# Patient Record
Sex: Male | Born: 1955 | Race: White | Hispanic: No | Marital: Married | Smoking: Never smoker
Health system: Southern US, Community
[De-identification: ages and names within clinical notes are randomized; demographics above are authoritative.]

## PROBLEM LIST (undated history)

## (undated) DIAGNOSIS — K219 Gastro-esophageal reflux disease without esophagitis: Secondary | ICD-10-CM

## (undated) DIAGNOSIS — C801 Malignant (primary) neoplasm, unspecified: Secondary | ICD-10-CM

## (undated) DIAGNOSIS — N189 Chronic kidney disease, unspecified: Secondary | ICD-10-CM

## (undated) HISTORY — PX: APPENDECTOMY: SHX54

## (undated) HISTORY — PX: TONSILLECTOMY: SUR1361

---

## 2012-02-29 ENCOUNTER — Other Ambulatory Visit (HOSPITAL_BASED_OUTPATIENT_CLINIC_OR_DEPARTMENT_OTHER): Payer: Self-pay | Admitting: Family Medicine

## 2012-02-29 DIAGNOSIS — R109 Unspecified abdominal pain: Secondary | ICD-10-CM

## 2012-03-01 ENCOUNTER — Ambulatory Visit (HOSPITAL_BASED_OUTPATIENT_CLINIC_OR_DEPARTMENT_OTHER)
Admission: RE | Admit: 2012-03-01 | Discharge: 2012-03-01 | Disposition: A | Discharge status: Home / Self Care | Payer: Medicare PPO | Source: Ambulatory Visit | Attending: Family Medicine | Admitting: Family Medicine

## 2012-03-01 DIAGNOSIS — R109 Unspecified abdominal pain: Secondary | ICD-10-CM

## 2012-03-01 DIAGNOSIS — R319 Hematuria, unspecified: Secondary | ICD-10-CM | POA: Insufficient documentation

## 2012-03-01 DIAGNOSIS — N200 Calculus of kidney: Secondary | ICD-10-CM | POA: Insufficient documentation

## 2012-03-08 ENCOUNTER — Other Ambulatory Visit: Payer: Self-pay | Admitting: Urology

## 2012-03-09 MED ORDER — CIPROFLOXACIN HCL 500 MG PO TABS: 500.0000 mg | ORAL_TABLET | ORAL | Status: DC

## 2012-03-16 ENCOUNTER — Encounter (HOSPITAL_COMMUNITY): Payer: Self-pay | Admitting: Pharmacy Technician

## 2012-03-16 NOTE — Progress Notes (Signed)
1338  Spoke with Pam only give Cipro 200 mg IV with the cysto stent procedure at 0730 no antibiotic with the litho and do the KUB before the first procedure  It's okay per Dr. Vernie Ammons.

## 2012-03-24 NOTE — Patient Instructions (Addendum)
20 Marvin Patterson  03/24/2012   Your procedure is scheduled on: 10-28  -2013  Report to [facility] at     0530   AM   Call this number if you have problems the morning of surgery: [phone]  Or Presurgical Testing [phone]([name])   Remember: Bring blue folder with all forms completely filled out and signed. Follow instructions on any laxatives. Bring insurance card and picture ID.   Do not eat food:After Midnight.    Take these medicines the morning of surgery with A SIP OF WATER: none   Do not wear jewelry, make-up or nail polish.  Do not wear lotions, powders, or perfumes. You may wear deodorant.  Do not shave 48 hours prior to surgery.(face and neck okay, no shaving of legs)  Do not bring valuables to the hospital.  Contacts, dentures or bridgework may not be worn into surgery.  Leave suitcase in the car. After surgery it may be brought to your room.  For patients admitted to the hospital, checkout time is 11:00 AM the day of discharge.   Patients discharged the day of surgery will not be allowed to drive home. Must have responsible person with you x 24 hours once discharged.  Name and phone number of your driver: son Molli Hazard [phone] cell  Special Instructions: CHG Shower Use Special Wash: see special instruction sheet.(avoid face and genitals)   Please read over the following fact sheets that you were given: MRSA Information.

## 2012-03-25 ENCOUNTER — Encounter (HOSPITAL_COMMUNITY)
Admission: RE | Admit: 2012-03-25 | Discharge: 2012-03-25 | Disposition: A | Discharge status: Home / Self Care | Payer: 59 | Source: Ambulatory Visit | Attending: Urology | Admitting: Urology

## 2012-03-25 ENCOUNTER — Encounter (HOSPITAL_COMMUNITY): Payer: Self-pay

## 2012-03-25 DIAGNOSIS — C801 Malignant (primary) neoplasm, unspecified: Secondary | ICD-10-CM

## 2012-03-25 DIAGNOSIS — N189 Chronic kidney disease, unspecified: Secondary | ICD-10-CM

## 2012-03-25 HISTORY — DX: Chronic kidney disease, unspecified: N18.9

## 2012-03-25 HISTORY — DX: Gastro-esophageal reflux disease without esophagitis: K21.9

## 2012-03-25 HISTORY — DX: Malignant (primary) neoplasm, unspecified: C80.1

## 2012-03-25 HISTORY — PX: SALIVARY STONE REMOVAL: SHX5213

## 2012-03-25 HISTORY — PX: SKIN CANCER EXCISION: SHX779

## 2012-03-25 LAB — SURGICAL PCR SCREEN
MRSA, PCR: NEGATIVE
Staphylococcus aureus: NEGATIVE

## 2012-03-25 NOTE — Pre-Procedure Instructions (Signed)
03-25-12 Pt. Aware will have KUB on arrival 03-28-12. Pt's wife has hx. Dementia, son to drive pt. Home.

## 2012-03-25 NOTE — H&P (Signed)
History of Present Illness     Left renal calculus: A CT scan done on 03/01/12 revealed a 12 mm stone located in the renal pelvis of the left kidney. There was some associated inflammatory changes. The stone had Hounsfield units of approximately 1100.  He reports that possibly as far back as a year ago he had occasional twinges of intermittent pain in the left flank region. More recently has been having some abdominal discomfort on the left-hand side and noted that his urine was dark but not read. He was seen, evaluated and placed on medication for what were felt to be GI symptoms at the time but then he developed some pain in his back when he was doing some lifting around the house and eventually saw gross hematuria. That is when the CT scan was obtained and he is seen today not having any severe pain in his flank or hematuria this time. He has no prior history of stones.   Past Medical History Problems  1. History of  Esophageal Reflux 530.81 2. History of  Hiatal Hernia 553.3 3. History of  Hypercholesterolemia 272.0 4. History of  Skin Cancer V10.83  Surgical History Problems  1. History of  Appendectomy 2. History of  Tonsillectomy With Adenoidectomy  Current Meds 1. No Reported Medications  Allergies Medication  1. Amoxicillin TABS 2. Erythromycin TABS  Family History Problems  1. Family history of  Aneurysm Of The Abdominal Aorta 2. Family history of  Chronic Lupus Erythematosus 3. Family history of  Pulmonary Disease 4. Family history of  Rheumatoid Arthritis  Social History Problems    Being A Social Drinker   Caffeine Use   Marital History - Currently Married   Never A Smoker  Review of Systems Genitourinary, constitutional, skin, eye, otolaryngeal, hematologic/lymphatic, cardiovascular, pulmonary, endocrine, musculoskeletal, gastrointestinal, neurological and psychiatric system(s) were reviewed and pertinent findings if present are noted.    Vitals Vital  Signs BMI Calculated: 34.36 BSA Calculated: 2.26 Height: 5 ft 10 in Weight: 240 lb  Blood Pressure: 160 / 92 Heart Rate: 94  Physical Exam Constitutional: Well nourished and well developed . No acute distress.  ENT:. The ears and nose are normal in appearance.  Neck: The appearance of the neck is normal and no neck mass is present.  Pulmonary: No respiratory distress and normal respiratory rhythm and effort.  Cardiovascular: Heart rate and rhythm are normal . No peripheral edema.  Abdomen: The abdomen is soft and nontender. No masses are palpated. No CVA tenderness. No hernias are palpable. No hepatosplenomegaly noted.  Lymphatics: The femoral and inguinal nodes are not enlarged or tender.  Skin: Normal skin turgor, no visible rash and no visible skin lesions.  Neuro/Psych:. Mood and affect are appropriate.    Results/Data Urine COLOR RED   APPEARANCE CLEAR   SPECIFIC GRAVITY <1.005   pH 5.5   GLUCOSE NEG mg/dL  BILIRUBIN NEG   KETONE NEG mg/dL  BLOOD LARGE   PROTEIN 30 mg/dL  UROBILINOGEN 0.2 mg/dL  NITRITE NEG   LEUKOCYTE ESTERASE NEG   SQUAMOUS EPITHELIAL/HPF RARE   WBC 0-2 WBC/hpf  RBC 7-10 RBC/hpf  BACTERIA NONE SEEN   CRYSTALS NONE SEEN   CASTS NONE SEEN    Old records or history reviewed: Notes from Dr. Pablo Lawrence office as above.  The following images/tracing/specimen were independently visualized:  CT scan as above.  The following clinical lab reports were reviewed:  On 2 occasions his urinalysis had a pH of 5.0 and a urine  culture was found to be negative.  The following radiology reports were reviewed: CT scan.    Assessment Assessed  1. Nephrolithiasis Of The Left Kidney 592.0   We discussed the fact that he has a 12 mm left renal pelvic stone which could be managed with ureteroscopy although it might be somewhat of a challenge. We also discussed a percutaneous nephrolithotomy as an option and also lithotripsy. I gone over each of these as well as the pluses  and minuses associated. He would like to treat his stone with lithotripsy if possible and I told them with a stone of that size it very likely may require more than one treatment. In addition because of its large size I have recommended the placement of a double-J stent prior to initiating treatment. We've gone over this procedure and lithotripsy in detail. I've answered all of his questions and he has elected to proceed.   Plan    He will be scheduled for a left double-J stent placement followed by lithotripsy.

## 2012-03-28 ENCOUNTER — Ambulatory Visit (HOSPITAL_COMMUNITY): Payer: 59 | Admitting: Anesthesiology

## 2012-03-28 ENCOUNTER — Ambulatory Visit (HOSPITAL_COMMUNITY): Payer: 59

## 2012-03-28 ENCOUNTER — Encounter (HOSPITAL_COMMUNITY): Payer: Self-pay | Admitting: *Deleted

## 2012-03-28 ENCOUNTER — Encounter (HOSPITAL_COMMUNITY)
Admission: RE | Disposition: A | Discharge status: Home / Self Care | Payer: Self-pay | Source: Ambulatory Visit | Attending: Urology

## 2012-03-28 ENCOUNTER — Ambulatory Visit (HOSPITAL_COMMUNITY)
Admission: RE | Admit: 2012-03-28 | Discharge: 2012-03-28 | Disposition: A | Discharge status: Home / Self Care | Payer: 59 | Source: Ambulatory Visit | Attending: Urology | Admitting: Urology

## 2012-03-28 ENCOUNTER — Encounter (HOSPITAL_COMMUNITY): Payer: Self-pay | Admitting: Anesthesiology

## 2012-03-28 DIAGNOSIS — Z01812 Encounter for preprocedural laboratory examination: Secondary | ICD-10-CM | POA: Insufficient documentation

## 2012-03-28 DIAGNOSIS — K449 Diaphragmatic hernia without obstruction or gangrene: Secondary | ICD-10-CM | POA: Insufficient documentation

## 2012-03-28 DIAGNOSIS — E7800 Pure hypercholesterolemia, unspecified: Secondary | ICD-10-CM | POA: Insufficient documentation

## 2012-03-28 DIAGNOSIS — N200 Calculus of kidney: Secondary | ICD-10-CM | POA: Diagnosis present

## 2012-03-28 DIAGNOSIS — K219 Gastro-esophageal reflux disease without esophagitis: Secondary | ICD-10-CM | POA: Insufficient documentation

## 2012-03-28 SURGERY — CYSTOSCOPY, WITH STENT INSERTION
Anesthesia: General | Laterality: Left | Wound class: Clean Contaminated

## 2012-03-28 SURGERY — LITHOTRIPSY, ESWL
Anesthesia: LOCAL | Laterality: Left

## 2012-03-28 MED ORDER — LACTATED RINGERS IV SOLN: INTRAVENOUS | Status: DC

## 2012-03-28 MED ORDER — MIDAZOLAM HCL 5 MG/5ML IJ SOLN
INTRAMUSCULAR | Status: DC | PRN
  Administered 2012-03-28: 2 mg via INTRAVENOUS

## 2012-03-28 MED ORDER — ACETAMINOPHEN 10 MG/ML IV SOLN
INTRAVENOUS | Status: AC
  Filled 2012-03-28: qty 100

## 2012-03-28 MED ORDER — ACETAMINOPHEN 10 MG/ML IV SOLN
INTRAVENOUS | Status: DC | PRN
  Administered 2012-03-28: 1000 mg via INTRAVENOUS

## 2012-03-28 MED ORDER — DIAZEPAM 5 MG PO TABS
10.0000 mg | ORAL_TABLET | ORAL | Status: AC
  Administered 2012-03-28: 5 mg via ORAL
  Filled 2012-03-28: qty 1

## 2012-03-28 MED ORDER — IOHEXOL 300 MG/ML  SOLN
INTRAMUSCULAR | Status: DC | PRN
  Administered 2012-03-28: 10 mL

## 2012-03-28 MED ORDER — PROPOFOL 10 MG/ML IV BOLUS
INTRAVENOUS | Status: DC | PRN
  Administered 2012-03-28: 200 mg via INTRAVENOUS

## 2012-03-28 MED ORDER — TAMSULOSIN HCL 0.4 MG PO CAPS: 0.4000 mg | ORAL_CAPSULE | ORAL | Status: AC

## 2012-03-28 MED ORDER — DEXAMETHASONE SODIUM PHOSPHATE 10 MG/ML IJ SOLN
INTRAMUSCULAR | Status: DC | PRN
  Administered 2012-03-28: 10 mg via INTRAVENOUS

## 2012-03-28 MED ORDER — OXYCODONE-ACETAMINOPHEN 5-325 MG PO TABS
2.0000 | ORAL_TABLET | ORAL | Status: DC | PRN
  Administered 2012-03-28: 2 via ORAL
  Filled 2012-03-28: qty 2

## 2012-03-28 MED ORDER — LIDOCAINE HCL (CARDIAC) 20 MG/ML IV SOLN
INTRAVENOUS | Status: DC | PRN
  Administered 2012-03-28: 50 mg via INTRAVENOUS

## 2012-03-28 MED ORDER — PROMETHAZINE HCL 25 MG/ML IJ SOLN: 6.2500 mg | INTRAMUSCULAR | Status: DC | PRN

## 2012-03-28 MED ORDER — SODIUM CHLORIDE 0.9 % IV SOLN
INTRAVENOUS | Status: DC
  Administered 2012-03-28: 10:00:00 via INTRAVENOUS

## 2012-03-28 MED ORDER — IOHEXOL 300 MG/ML  SOLN
INTRAMUSCULAR | Status: AC
  Filled 2012-03-28: qty 1

## 2012-03-28 MED ORDER — LACTATED RINGERS IV SOLN
INTRAVENOUS | Status: DC | PRN
  Administered 2012-03-28: 07:00:00 via INTRAVENOUS

## 2012-03-28 MED ORDER — DIPHENHYDRAMINE HCL 25 MG PO CAPS
25.0000 mg | ORAL_CAPSULE | ORAL | Status: AC
  Administered 2012-03-28: 25 mg via ORAL
  Filled 2012-03-28: qty 1

## 2012-03-28 MED ORDER — FENTANYL CITRATE 0.05 MG/ML IJ SOLN
INTRAMUSCULAR | Status: DC | PRN
  Administered 2012-03-28: 100 ug via INTRAVENOUS

## 2012-03-28 MED ORDER — STERILE WATER FOR IRRIGATION IR SOLN
Status: DC | PRN
  Administered 2012-03-28: 1500 mL

## 2012-03-28 MED ORDER — ONDANSETRON HCL 4 MG/2ML IJ SOLN
INTRAMUSCULAR | Status: DC | PRN
  Administered 2012-03-28: 4 mg via INTRAVENOUS

## 2012-03-28 MED ORDER — HYDROMORPHONE HCL PF 1 MG/ML IJ SOLN
0.2500 mg | INTRAMUSCULAR | Status: DC | PRN
  Administered 2012-03-28: 0.5 mg via INTRAVENOUS
  Filled 2012-03-28: qty 1

## 2012-03-28 MED ORDER — 0.9 % SODIUM CHLORIDE (POUR BTL) OPTIME
TOPICAL | Status: DC | PRN
  Administered 2012-03-28: 1000 mL

## 2012-03-28 MED ORDER — CIPROFLOXACIN IN D5W 200 MG/100ML IV SOLN
200.0000 mg | INTRAVENOUS | Status: AC
  Administered 2012-03-28: 200 mg via INTRAVENOUS
  Filled 2012-03-28: qty 100

## 2012-03-28 MED ORDER — OXYCODONE-ACETAMINOPHEN 10-325 MG PO TABS: 1.0000 | ORAL_TABLET | ORAL | Status: AC | PRN

## 2012-03-28 SURGICAL SUPPLY — 14 items
ADAPTER CATH URET PLST 4-6FR (CATHETERS) IMPLANT
BAG URO CATCHER STRL LF (DRAPE) ×2 IMPLANT
CATH INTERMIT  6FR 70CM (CATHETERS) ×2 IMPLANT
CATH URET 5FR 28IN OPEN ENDED (CATHETERS) IMPLANT
CLOTH BEACON ORANGE TIMEOUT ST (SAFETY) ×2 IMPLANT
DRAPE CAMERA CLOSED 9X96 (DRAPES) ×2 IMPLANT
GLOVE BIOGEL M 8.0 STRL (GLOVE) ×2 IMPLANT
GOWN PREVENTION PLUS XLARGE (GOWN DISPOSABLE) ×2 IMPLANT
GOWN STRL REIN XL XLG (GOWN DISPOSABLE) ×2 IMPLANT
GUIDEWIRE STR DUAL SENSOR (WIRE) ×2 IMPLANT
MANIFOLD NEPTUNE II (INSTRUMENTS) ×2 IMPLANT
PACK CYSTO (CUSTOM PROCEDURE TRAY) ×2 IMPLANT
STENT CONTOUR 6FRX24X.038 (STENTS) ×2 IMPLANT
TUBING CONNECTING 10 (TUBING) ×2 IMPLANT

## 2012-03-28 NOTE — Anesthesia Postprocedure Evaluation (Signed)
Anesthesia Post Note  Patient: Marvin Patterson  Procedure(s) Performed: Procedure(s) (LRB): CYSTOSCOPY WITH STENT PLACEMENT (Left)  Anesthesia type: General  Patient location: PACU  Post pain: Pain level controlled  Post assessment: Post-op Vital signs reviewed  Last Vitals:  Filed Vitals:   03/28/12 0802  BP: 168/103  Pulse: 88  Temp: 36.9 C  Resp: 17    Post vital signs: Reviewed  Level of consciousness: sedated  Complications: No apparent anesthesia complications

## 2012-03-28 NOTE — Progress Notes (Signed)
Pt ambulated to bathroom and tolerated well after ESWL

## 2012-03-28 NOTE — Interval H&P Note (Signed)
History and Physical Interval Note:  03/28/2012 7:31 AM  Marvin Patterson  has presented today for surgery, with the diagnosis of Left Renal Calculus  The various methods of treatment have been discussed with the patient and family. After consideration of risks, benefits and other options for treatment, the patient has consented to  Procedure(s) (LRB) with comments: CYSTOSCOPY WITH STENT PLACEMENT (Left) as a surgical intervention .  The patient's history has been reviewed, patient examined, no change in status, stable for surgery.  I have reviewed the patient's chart and labs.  Questions were answered to the patient's satisfaction.     [place]

## 2012-03-28 NOTE — Anesthesia Preprocedure Evaluation (Signed)
Anesthesia Evaluation  Patient identified by MRN, date of birth, ID band Patient awake    Reviewed: Allergy & Precautions, H&P , NPO status , Patient's Chart, lab work & pertinent test results  Airway Mallampati: II TM Distance: >3 FB Neck ROM: Full    Dental  (+) Teeth Intact and Dental Advisory Given   Pulmonary neg pulmonary ROS,  breath sounds clear to auscultation  Pulmonary exam normal       Cardiovascular negative cardio ROS  Rhythm:Regular Rate:Normal     Neuro/Psych negative neurological ROS  negative psych ROS   GI/Hepatic Neg liver ROS, GERD-  Controlled,  Endo/Other  Morbid obesity  Renal/GU Renal disease  negative genitourinary   Musculoskeletal negative musculoskeletal ROS (+)   Abdominal   Peds  Hematology negative hematology ROS (+)   Anesthesia Other Findings   Reproductive/Obstetrics negative OB ROS                           Anesthesia Physical Anesthesia Plan  ASA: II  Anesthesia Plan: General   Post-op Pain Management:    Induction: Intravenous  Airway Management Planned: LMA  Additional Equipment:   Intra-op Plan:   Post-operative Plan: Extubation in OR  Informed Consent: I have reviewed the patients History and Physical, chart, labs and discussed the procedure including the risks, benefits and alternatives for the proposed anesthesia with the patient or authorized representative who has indicated his/her understanding and acceptance.   Dental advisory given  Plan Discussed with: CRNA  Anesthesia Plan Comments:         Anesthesia Quick Evaluation

## 2012-03-28 NOTE — Progress Notes (Signed)
Spoke with Dr. Vernie Ammons regarding pt's increase in left flank pain post op.  He was informed that pt did not receive anything for pain in PACU.  Dr. Vernie Ammons ordered same Dilaudid parameters as previously ordered in PACU.  Pt's pain is 5/10 currently.

## 2012-03-28 NOTE — Op Note (Signed)
See [facility] OP note scanned into chart. 

## 2012-03-28 NOTE — Transfer of Care (Signed)
Immediate Anesthesia Transfer of Care Note  Patient: Marvin Patterson  Procedure(s) Performed: Procedure(s) (LRB) with comments: CYSTOSCOPY WITH STENT PLACEMENT (Left)  Patient Location: PACU  Anesthesia Type:General  Level of Consciousness: sedated  Airway & Oxygen Therapy: Patient Spontanous Breathing and Patient connected to face mask oxygen  Post-op Assessment: Report given to PACU RN and Post -op Vital signs reviewed and stable  Post vital signs: Reviewed and stable  Complications: No apparent anesthesia complications

## 2012-03-28 NOTE — Op Note (Signed)
PATIENT:  Marvin Patterson  Preoperative diagnosis:  1. Left renal calculus  Postoperative diagnosis:  1. Same  Procedure:  1. Cystoscopy 2. Left ureteral stent placement (6 French, 24 cm) (no string) 3. Left retrograde pyelography with interpretation  Surgeon: Loraine Leriche C. Vernie Ammons, M.D.  Anesthesia: General  Complications: None  EBL: None  Specimens: None  Indication: The patient is a 56 year old male who experienced gross hematuria and was found by CT scan to have a 12 mm stone located in his left renal pelvis. Hounsfield units were measured 1100. We discussed the treatment options and he has elected to proceed with lithotripsy. I therefore have recommended placement of a stent prior to the procedure. We have discussed the potential benefits and risks of the procedure, side effects of the proposed treatment, the likelihood of the patient achieving the goals of the procedure, and any potential problems that might occur during the procedure or recuperation. Informed consent has been obtained.  Description of procedure:  The patient was taken to the operating room and general anesthesia was induced.  The patient was placed in the dorsal lithotomy position, prepped and draped in the usual sterile fashion, and preoperative antibiotics were administered. A preoperative time-out was performed.   Cystourethroscopy was performed.  The patient's urethra was examined and was normal. The prostatic urethra revealed mild bilobar hypertrophy but was nonobstructing and free of lesions The bladder was then systematically examined in its entirety. There was no evidence for any bladder tumors, stones, or other mucosal pathology.  The ureteral orifices were noted to be of normal position and configuration.  Attention then turned to the left ureteral orifice and a ureteral catheter was used to intubate the ureteral orifice.  Omnipaque contrast was injected through the 6 [place] ureteral catheter and a  retrograde pyelogram was performed under direct fluoroscopic control. This revealed a normal ureter throughout its length without any filling defects or other abnormalities. The intrarenal collecting system was normal in appearance with sharp calyces and a filling defect in the intrarenal pelvis consistent with his known stone.  A 0.038 sensor guidewire was then advanced up the left ureter into the renal pelvis under fluoroscopic guidance.  The wire was then backloaded through the cystoscope and a ureteral stent was advance over the wire using Seldinger technique.  The stent was positioned appropriately under fluoroscopic and cystoscopic guidance.  The wire was then removed with an adequate stent curl noted in the renal pelvis as well as in the bladder.  The bladder was then emptied and the procedure ended.  The patient appeared to tolerate the procedure well and without complications.  The patient was able to be awakened and transferred to the recovery unit in satisfactory condition.

## 2013-04-07 IMAGING — CT CT ABD-PELV W/O CM
2 of 4 series · 17 of 46 positions shown, 19 images · non-contrast
Comparison: None.

CLINICAL DATA: Left flank pain.  Hematuria.

CT ABDOMEN AND PELVIS WITHOUT CONTRAST
TECHNIQUE: Multidetector CT imaging of the abdomen and pelvis was
performed following the standard protocol without intravenous
contrast.

[Series 2: renal stone < 200 lbs 5.0 b31f · axial · 0.93mm/px · z∈[-486,-71]mm · 14 of 91 slices shown, 16 images]
[im 4/91  soft-tissue]
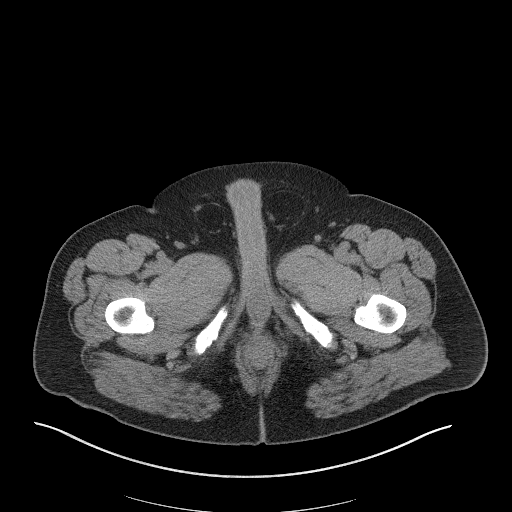
[im 4/91  bone]
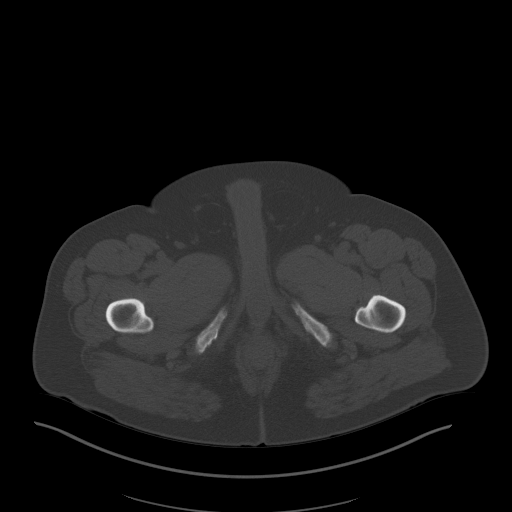
[im 11/91  soft-tissue]
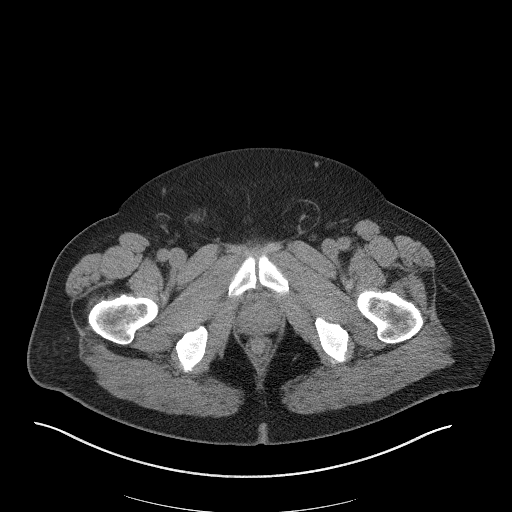
[im 19/91  soft-tissue]
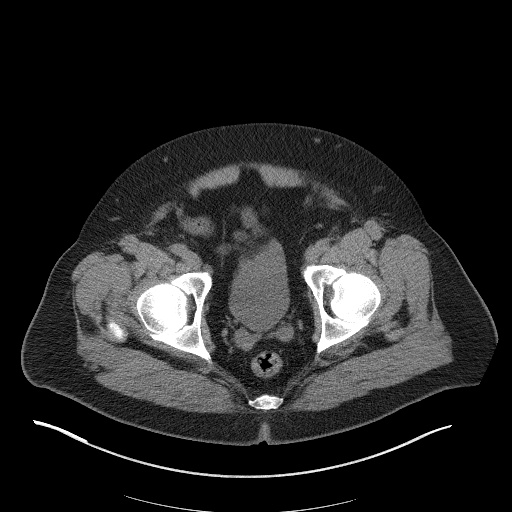
[im 26/91  soft-tissue]
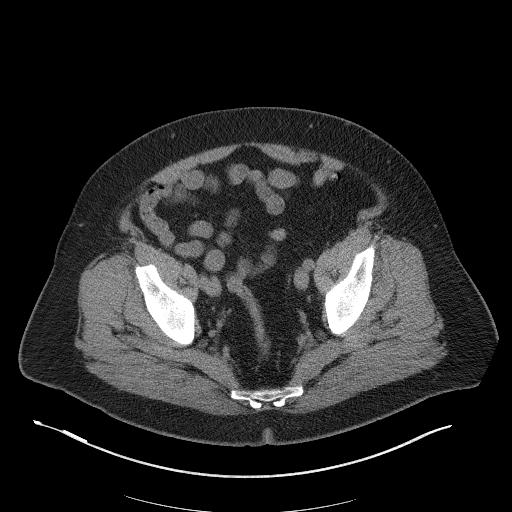
[im 29/91  soft-tissue]
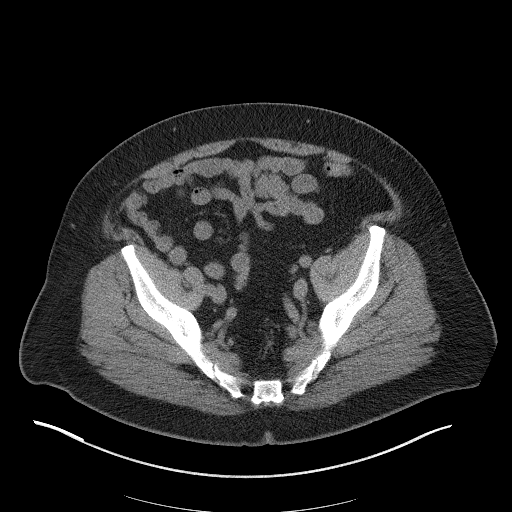
[im 37/91  soft-tissue]
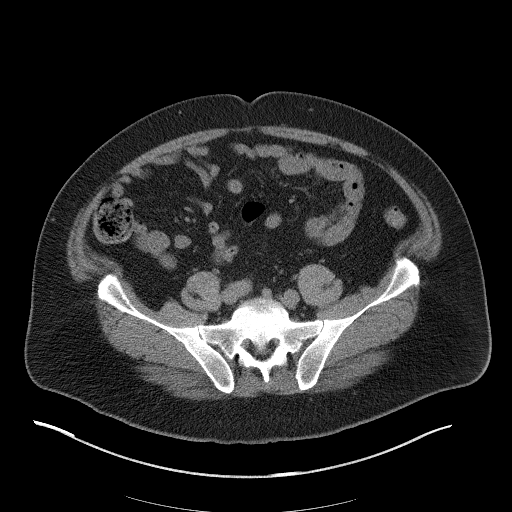
[im 44/91  soft-tissue]
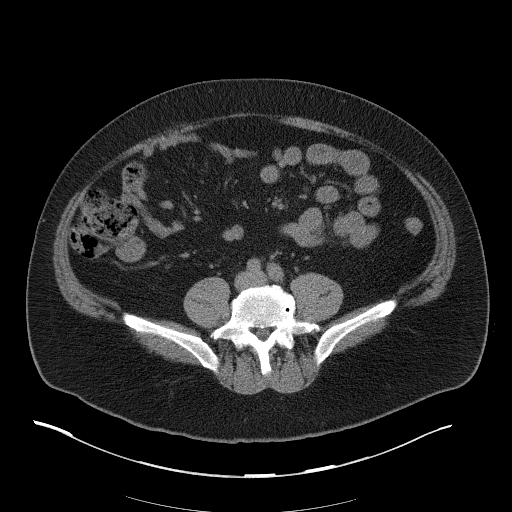
[im 47/91  soft-tissue]
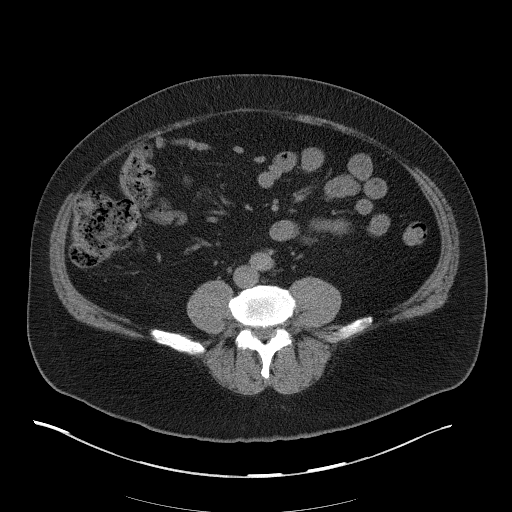
[im 55/91  soft-tissue]
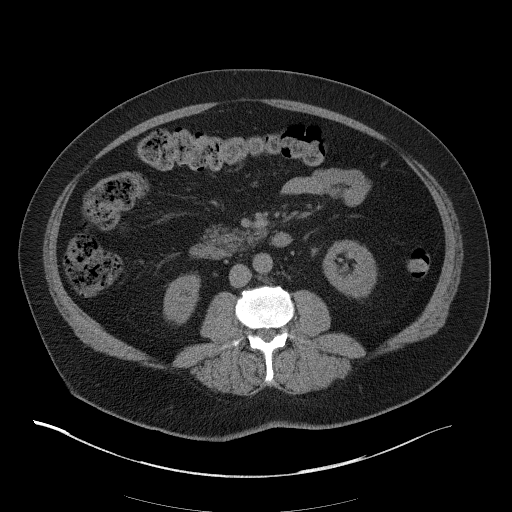
[im 55/91  bone]
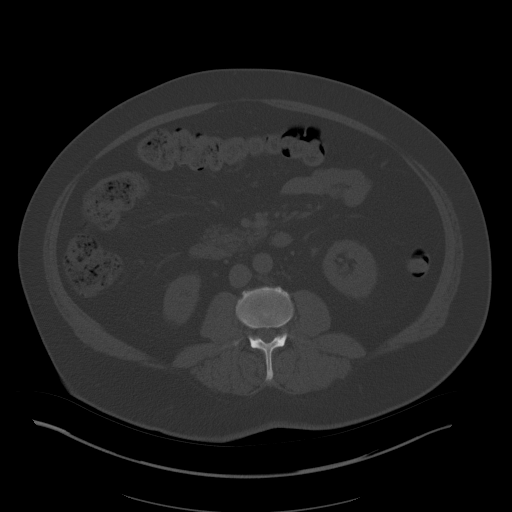
[im 62/91  soft-tissue]
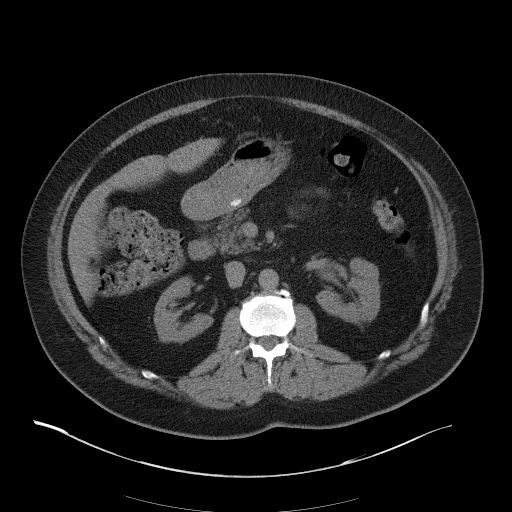
[im 69/91  soft-tissue]
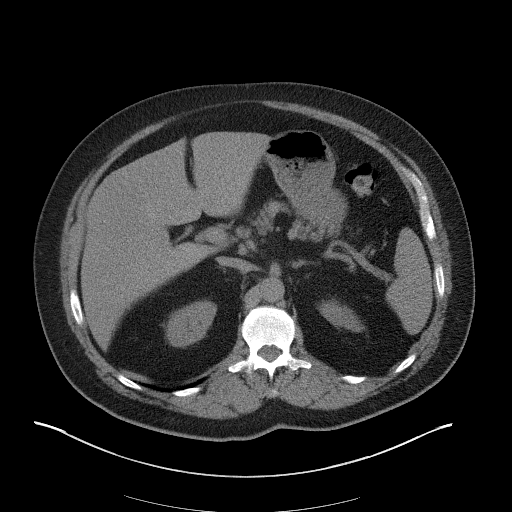
[im 73/91  soft-tissue]
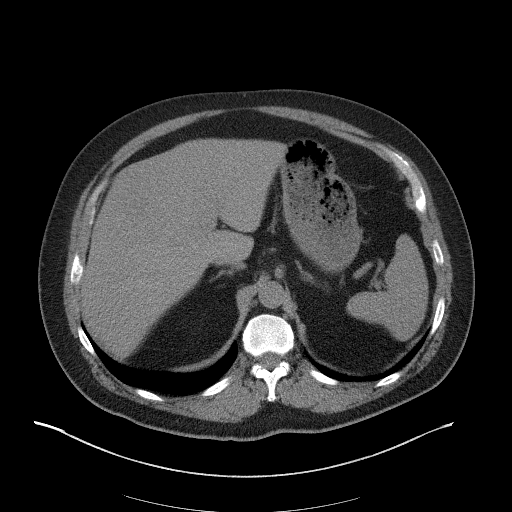
[im 80/91  soft-tissue]
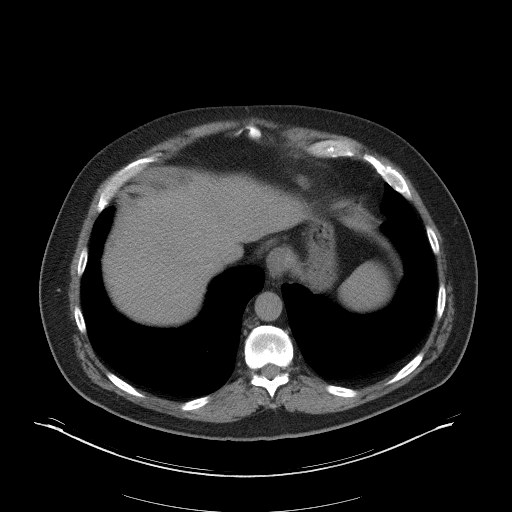
[im 87/91  soft-tissue]
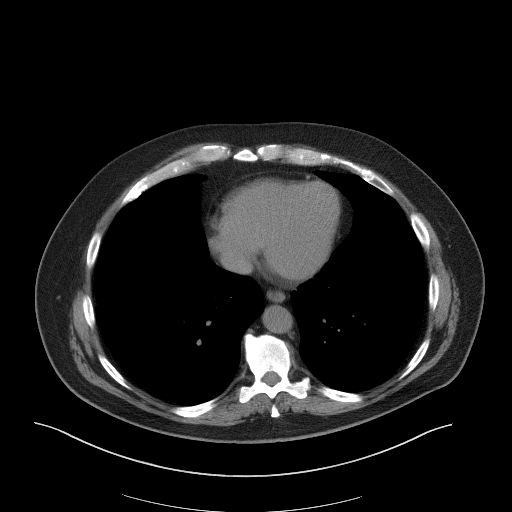

[Series 5: renal stone 3.0 coronal · coronal · 0.91mm/px · 3 of 116 slices shown]
[im 39/116  soft-tissue]
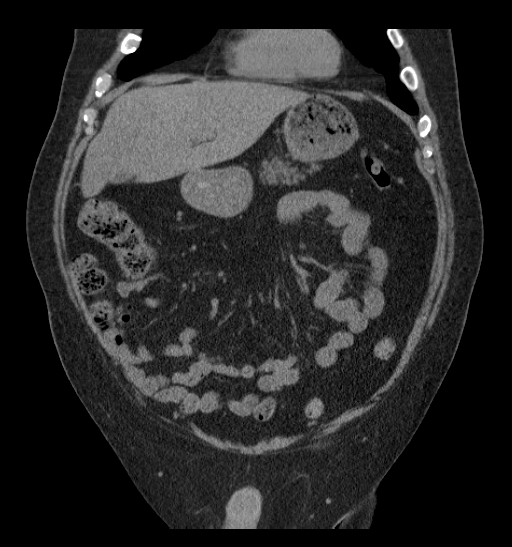
[im 52/116  soft-tissue]
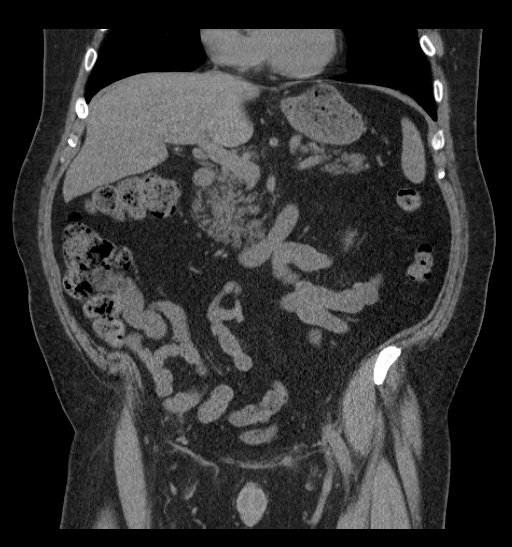
[im 64/116  soft-tissue]
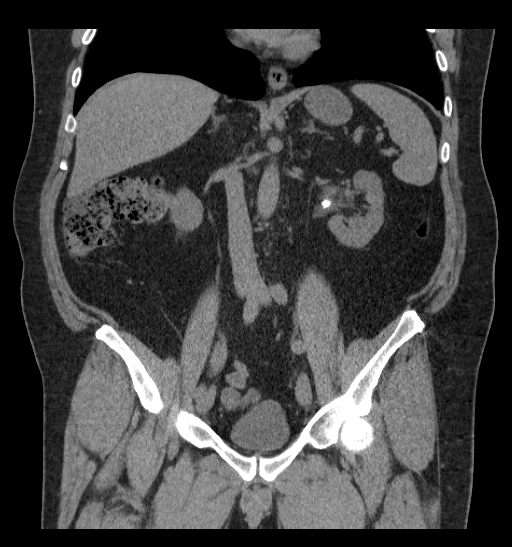

[17 of 46 positions shown; findings below may reference images not displayed]

FINDINGS: A 12 mm calculus is seen in the left renal pelvis.  There
is mild left pelviectasis, without significant caliectasis.  No
evidence of ureteral calculi or dilatation.  No bladder calculi
identified. Right kidney has a normal appearance on this
noncontrast study.

The liver, gallbladder, spleen, pancreas, and adrenal glands have a
normal appearance on this noncontrast study.  No soft tissue masses
or lymphadenopathy identified.  No evidence of inflammatory process
or abnormal fluid collections.  No evidence of dilated bowel loops.
IMPRESSION: 12 mm calculus in the left renal pelvis, with mild pelviectasis.

## 2013-05-04 IMAGING — CR DG ABDOMEN 1V
2 series · 2 of 2 positions shown · non-contrast
Comparison: 03/01/2012

CLINICAL DATA: Left-sided kidney stone.  Pre lithotripsy this
morning.

ABDOMEN - 1 VIEW

[t abdomen supine (1 of 2)]
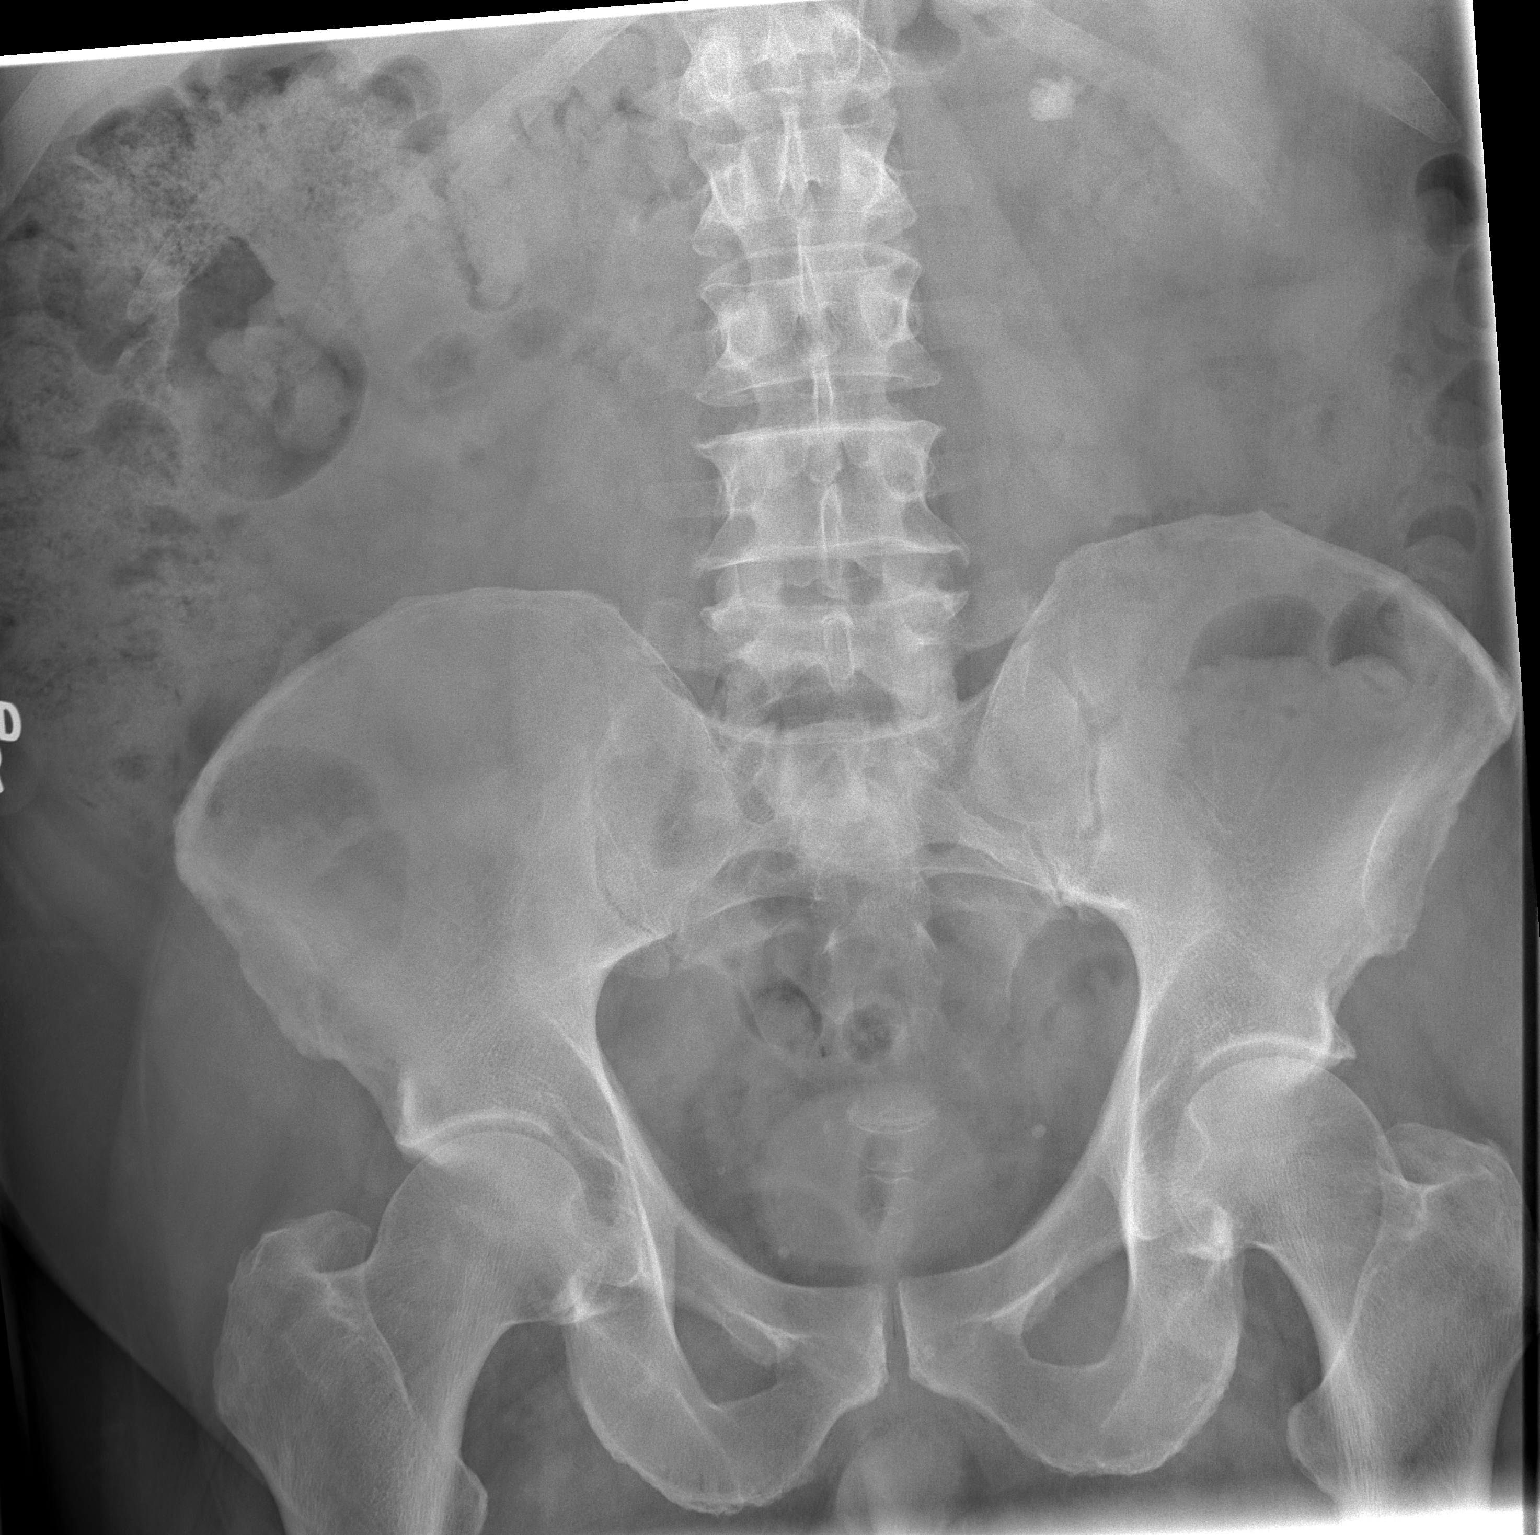

[t abdomen supine (2 of 2)]
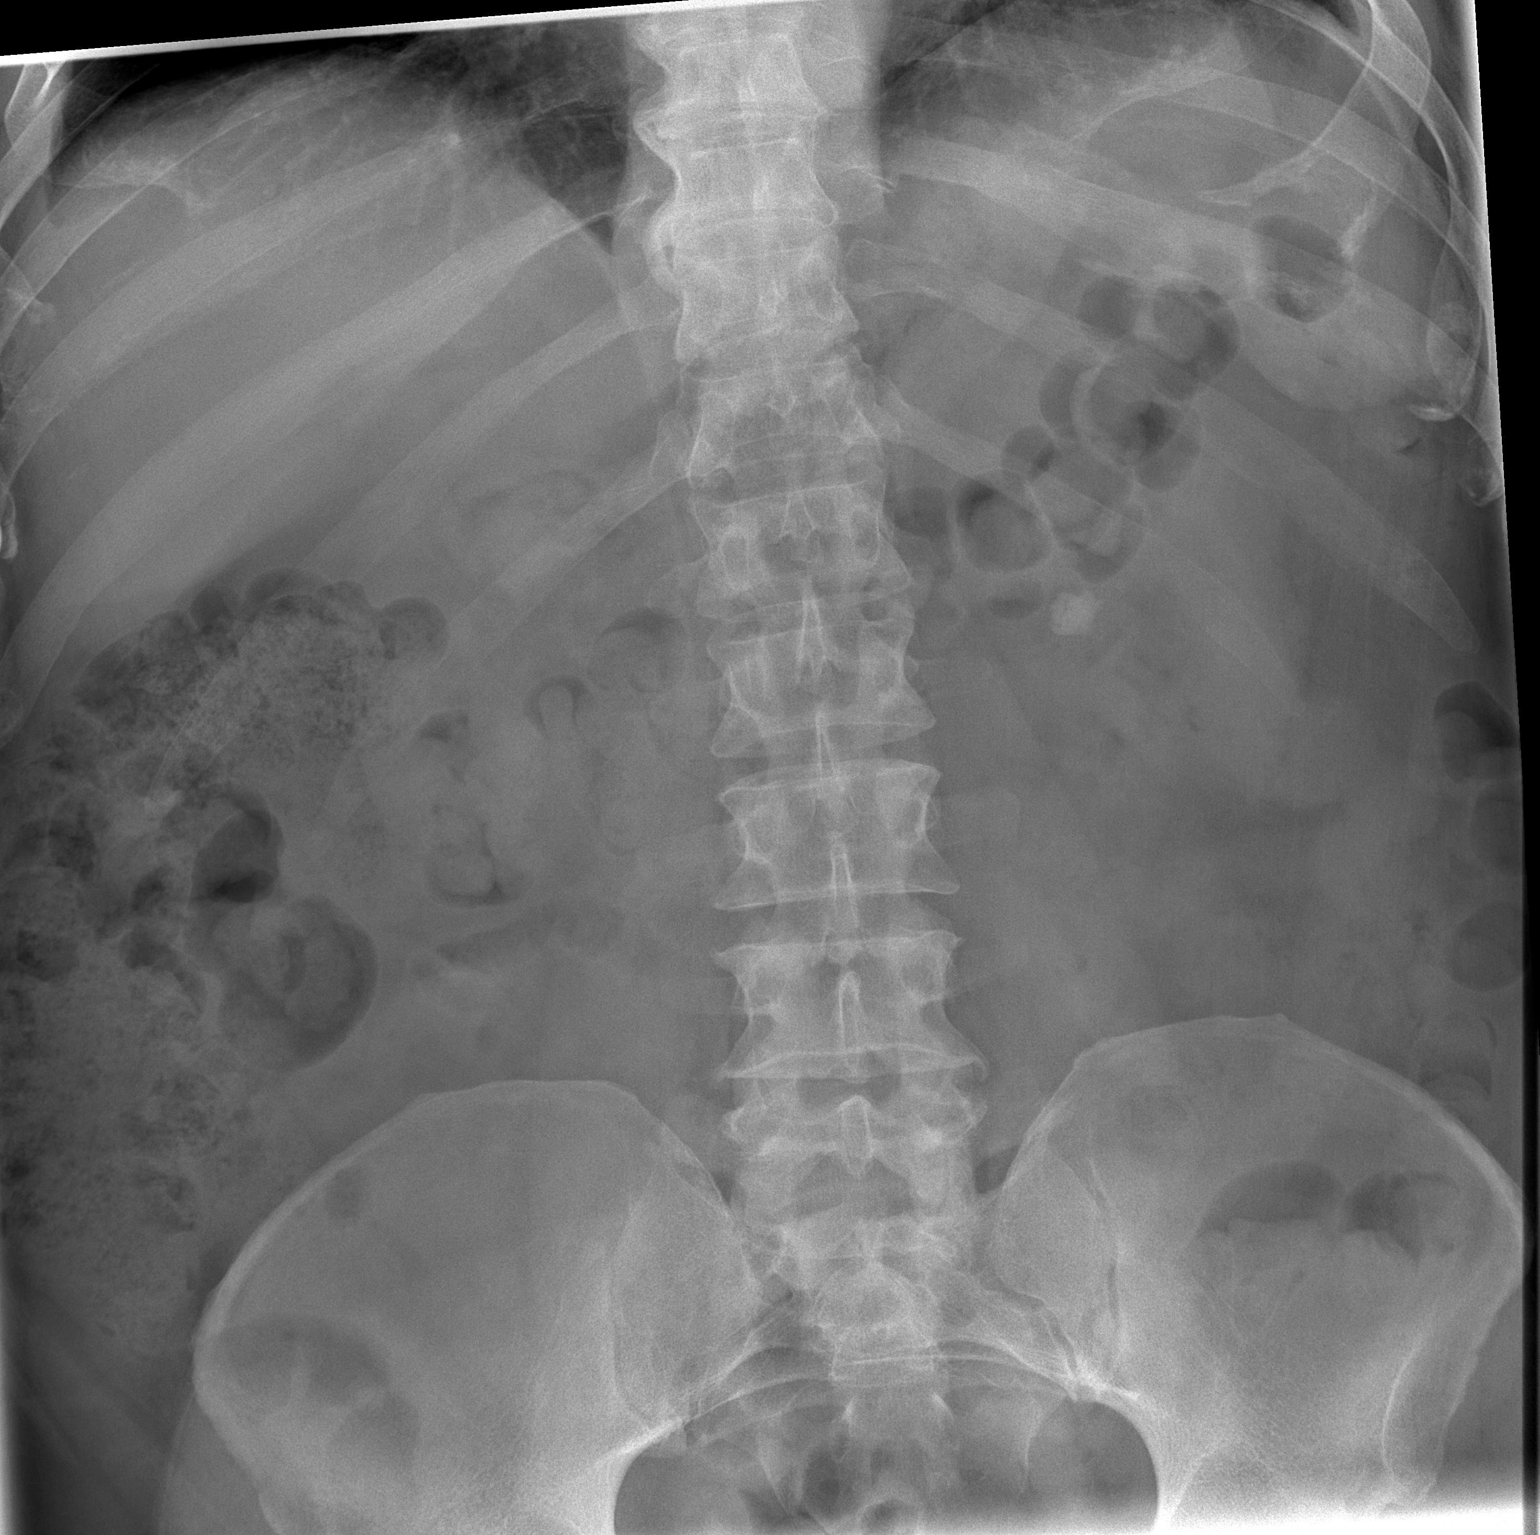

[2 of 2 positions shown; findings below may reference images not displayed]

FINDINGS: 2 supine views of the abdomen and pelvis.  1.3 cm
calcification projecting over the left renal pelvis.  No other
calcific densities over the kidneys. Probable phleboliths in the
pelvis.  Non-obstructive bowel gas pattern.
IMPRESSION: No change in position of a 1.3 cm left renal pelvic calculus.

## 2019-08-03 ENCOUNTER — Ambulatory Visit: Payer: Medicare PPO | Attending: Internal Medicine

## 2019-08-03 DIAGNOSIS — Z23 Encounter for immunization: Secondary | ICD-10-CM

## 2019-08-30 ENCOUNTER — Ambulatory Visit: Payer: Self-pay | Attending: Internal Medicine

## 2019-08-30 DIAGNOSIS — Z23 Encounter for immunization: Secondary | ICD-10-CM

## 2019-08-30 NOTE — Progress Notes (Signed)
   Covid-19 Vaccination Clinic  Name:  Marvin Patterson    MRN: [id number] DOB: Feb 22, 1956  08/30/2019  Mr. Robertshaw was observed post Covid-19 immunization for 15 minutes without incident. He was provided with Vaccine Information Sheet and instruction to access the V-Safe system.   Mr. Banducci was instructed to call 911 with any severe reactions post vaccine: Marland Kitchen Difficulty breathing  . Swelling of face and throat  . A fast heartbeat  . A bad rash all over body  . Dizziness and weakness   Immunizations Administered    Name Date Dose VIS Date Route   Pfizer COVID-19 Vaccine 08/30/2019  4:18 PM 0.3 mL 05/12/2019 Intramuscular   Manufacturer: [employer], [employer]   Lot: [id number]   [place]: [id number]

## 2022-06-02 ENCOUNTER — Other Ambulatory Visit: Payer: Self-pay | Admitting: Family Medicine

## 2022-06-02 ENCOUNTER — Ambulatory Visit (INDEPENDENT_AMBULATORY_CARE_PROVIDER_SITE_OTHER): Payer: Medicare Other

## 2022-06-02 DIAGNOSIS — R109 Unspecified abdominal pain: Secondary | ICD-10-CM | POA: Diagnosis not present

## 2022-06-02 DIAGNOSIS — R319 Hematuria, unspecified: Secondary | ICD-10-CM

## 2024-01-17 ENCOUNTER — Telehealth: Payer: Self-pay | Admitting: Diagnostic Neuroimaging

## 2024-01-17 NOTE — Telephone Encounter (Signed)
 Appointment r/s due to a conflict

## 2024-01-24 ENCOUNTER — Ambulatory Visit: Admitting: Diagnostic Neuroimaging

## 2024-05-23 ENCOUNTER — Other Ambulatory Visit: Payer: Self-pay | Admitting: Family Medicine

## 2024-05-23 ENCOUNTER — Ambulatory Visit

## 2024-05-23 DIAGNOSIS — R079 Chest pain, unspecified: Secondary | ICD-10-CM | POA: Diagnosis not present

## 2024-05-23 DIAGNOSIS — R059 Cough, unspecified: Secondary | ICD-10-CM

## 2024-06-19 ENCOUNTER — Ambulatory Visit: Admitting: Diagnostic Neuroimaging

## 2024-06-19 ENCOUNTER — Telehealth: Payer: Self-pay | Admitting: Diagnostic Neuroimaging

## 2024-06-19 NOTE — Telephone Encounter (Signed)
 Pt has cx, due to sinus infection , going to see PCP, will call back to r/s.
